# Patient Record
Sex: Female | Born: 2005 | Hispanic: Yes | Marital: Single | Smoking: Never smoker
Health system: Southern US, Community
[De-identification: ages and names within clinical notes are randomized; demographics above are authoritative.]

## PROBLEM LIST (undated history)

## (undated) DIAGNOSIS — N83209 Unspecified ovarian cyst, unspecified side: Secondary | ICD-10-CM

## (undated) DIAGNOSIS — K219 Gastro-esophageal reflux disease without esophagitis: Secondary | ICD-10-CM

---
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
## 2023-01-31 ENCOUNTER — Emergency Department: Payer: Medicaid Other

## 2023-01-31 ENCOUNTER — Other Ambulatory Visit: Payer: Self-pay

## 2023-01-31 ENCOUNTER — Emergency Department
Admission: EM | Admit: 2023-01-31 | Discharge: 2023-01-31 | Disposition: A | Discharge status: Home / Self Care | Payer: Medicaid Other | Attending: Emergency Medicine | Admitting: Emergency Medicine

## 2023-01-31 DIAGNOSIS — N390 Urinary tract infection, site not specified: Secondary | ICD-10-CM | POA: Insufficient documentation

## 2023-01-31 DIAGNOSIS — R1030 Lower abdominal pain, unspecified: Secondary | ICD-10-CM | POA: Diagnosis present

## 2023-01-31 DIAGNOSIS — R7401 Elevation of levels of liver transaminase levels: Secondary | ICD-10-CM | POA: Insufficient documentation

## 2023-01-31 DIAGNOSIS — M545 Low back pain, unspecified: Secondary | ICD-10-CM | POA: Insufficient documentation

## 2023-01-31 LAB — COMPREHENSIVE METABOLIC PANEL
ALT: 13 U/L (ref 0–44)
AST: 18 U/L (ref 15–41)
Albumin: 4 g/dL (ref 3.5–5.0)
Alkaline Phosphatase: 48 U/L (ref 38–126)
Anion gap: 10 (ref 5–15)
BUN: 12 mg/dL (ref 6–20)
CO2: 23 mmol/L (ref 22–32)
Calcium: 8.8 mg/dL — ABNORMAL LOW (ref 8.9–10.3)
Chloride: 104 mmol/L (ref 98–111)
Creatinine, Ser: 0.9 mg/dL (ref 0.44–1.00)
GFR, Estimated: >60 mL/min (ref >60)
Glucose, Bld: 74 mg/dL (ref 70–99)
Potassium: 3.4 mmol/L — ABNORMAL LOW (ref 3.5–5.1)
Sodium: 137 mmol/L (ref 135–145)
Total Bilirubin: 1.2 mg/dL (ref 0.3–1.2)
Total Protein: 7.5 g/dL (ref 6.5–8.1)

## 2023-01-31 LAB — URINALYSIS, ROUTINE W REFLEX MICROSCOPIC
Bilirubin Urine: NEGATIVE
Glucose, UA: NEGATIVE mg/dL
Hgb urine dipstick: NEGATIVE
Ketones, ur: NEGATIVE mg/dL
Nitrite: NEGATIVE
Protein, ur: NEGATIVE mg/dL
Specific Gravity, Urine: 1.018 (ref 1.005–1.030)
pH: 6 (ref 5.0–8.0)

## 2023-01-31 LAB — CBC
HCT: 37.1 % (ref 36.0–46.0)
Hemoglobin: 12.6 g/dL (ref 12.0–15.0)
MCH: 29 pg (ref 26.0–34.0)
MCHC: 34 g/dL (ref 30.0–36.0)
MCV: 85.5 fL (ref 80.0–100.0)
Platelets: 300 10*3/uL (ref 150–400)
RBC: 4.34 MIL/uL (ref 3.87–5.11)
RDW: 12.9 % (ref 11.5–15.5)
WBC: 9 10*3/uL (ref 4.0–10.5)
nRBC: 0 % (ref 0.0–0.2)

## 2023-01-31 LAB — LIPASE, BLOOD: Lipase: 73 U/L — ABNORMAL HIGH (ref 11–51)

## 2023-01-31 LAB — PREGNANCY, URINE: Preg Test, Ur: NEGATIVE

## 2023-01-31 MED ORDER — CEPHALEXIN 500 MG PO CAPS
500.0000 mg | ORAL_CAPSULE | Freq: Once | ORAL | Status: AC
  Administered 2023-01-31: 500 mg via ORAL
  Filled 2023-01-31: qty 1

## 2023-01-31 MED ORDER — IBUPROFEN 600 MG PO TABS
600.0000 mg | ORAL_TABLET | Freq: Once | ORAL | Status: AC
  Administered 2023-01-31: 600 mg via ORAL
  Filled 2023-01-31: qty 1

## 2023-01-31 MED ORDER — CEPHALEXIN 500 MG PO CAPS: 500.0000 mg | ORAL_CAPSULE | Freq: Two times a day (BID) | ORAL | 0 refills | Status: AC

## 2023-01-31 MED ORDER — IBUPROFEN 600 MG PO TABS: 600.0000 mg | ORAL_TABLET | Freq: Four times a day (QID) | ORAL | 0 refills | Status: DC | PRN

## 2023-01-31 NOTE — ED Notes (Signed)
Patient transported to CT 

## 2023-01-31 NOTE — ED Provider Notes (Signed)
[facility] Provider Note    Event Date/Time   First MD Initiated Contact with Patient 01/31/23 1638     (approximate)   History   Abdominal Pain   HPI  Dominique Hickman is a 17 y.o. female with positive ANA but otherwise no significant PMH who presents with lower back and abdominal pain since last night.  The patient states that she started having pain across both sides of her lower back that then radiated around to her abdomen.  It is slightly worse on the right but is still present on the left.  She denies any associated nausea or vomiting.  She denies any dysuria or hematuria but does have some hesitancy.  She denies any vaginal bleeding or discharge.  She has no diarrhea or change in her bowel movements.  I reviewed the past medical records.  The patient's most recent outpatient encounter was with Dr. Dimple Casey from rheumatology on 8/9 for evaluation of positive ANA.  She was ordered for additional rheumatologic workup at that time.   Physical Exam   Triage Vital Signs: ED Triage Vitals  Encounter Vitals Group     BP 01/31/23 1547 105/69     Systolic BP Percentile --      Diastolic BP Percentile --      Pulse Rate 01/31/23 1547 73     Resp 01/31/23 1547 17     Temp 01/31/23 1547 98 F (36.7 C)     Temp Source 01/31/23 1547 Oral     SpO2 01/31/23 1547 99 %     Weight 01/31/23 1548 135 lb (61.2 kg)     Height 01/31/23 1548 5\' 5"  (1.651 m)     Head Circumference --      Peak Flow --      Pain Score 01/31/23 1548 7     Pain Loc --      Pain Education --      Exclude from Growth Chart --     Most recent vital signs: Vitals:   01/31/23 2104 01/31/23 2125  BP: (!) 110/54   Pulse: 65   Resp:  16  Temp:  98.2 F (36.8 C)  SpO2: 100%      General: Alert, well-appearing, no distress.  CV:  Good peripheral perfusion.  Resp:  Normal effort.  Abd:  Soft with mild bilateral lower quadrant tenderness.  No distention.  Other:  Mild right CVA  tenderness.   ED Results / Procedures / Treatments   Labs (all labs ordered are listed, but only abnormal results are displayed) Labs Reviewed  LIPASE, BLOOD - Abnormal; Notable for the following components:      Result Value   Lipase 73 (*)    All other components within normal limits  COMPREHENSIVE METABOLIC PANEL - Abnormal; Notable for the following components:   Potassium 3.4 (*)    Calcium 8.8 (*)    All other components within normal limits  URINALYSIS, ROUTINE W REFLEX MICROSCOPIC - Abnormal; Notable for the following components:   Color, Urine YELLOW (*)    APPearance CLOUDY (*)    Leukocytes,Ua TRACE (*)    Bacteria, UA FEW (*)    All other components within normal limits  CBC  PREGNANCY, URINE  POC URINE PREG, ED     EKG     RADIOLOGY  CT renal stone study: I independently viewed and interpreted the images; there is no ureteral stone.  Radiology report indicates no acute abnormalities.  PROCEDURES:  Critical Care performed: No  Procedures   MEDICATIONS ORDERED IN ED: Medications  ibuprofen (ADVIL) tablet 600 mg (600 mg Oral Given 01/31/23 1728)  cephALEXin (KEFLEX) capsule 500 mg (500 mg Oral Given 01/31/23 2125)     IMPRESSION / MDM / ASSESSMENT AND PLAN / ED COURSE  I reviewed the triage vital signs and the nursing notes.  17 year old female with PMH as noted above presents with bilateral lower back pain radiating around to both sides of her lower abdomen, slightly worse on the right.  On exam she has mild bilateral lower quadrant tenderness and mild right CVA tenderness.  Initial labs show normal electrolytes on the CMP.  CBC shows no leukocytosis or anemia.  Lipase is minimally elevated but this appears chronic.  Urinalysis shows leukocyte esterace, some RBCs, WBCs, and bacteria.  Differential diagnosis includes, but is not limited to, ureteral stone, UTI, musculoskeletal pain, lightest, diverticulitis.  Given the relatively mild pain and the  location there is no evidence of ovarian cyst, fibroid, torsion, or other gynecologic etiology.  We will obtain CT for further evaluation.  Patient's presentation is most consistent with acute complicated illness / injury requiring diagnostic workup.  ----------------------------------------- 9:04 PM on 01/31/2023 -----------------------------------------  CT is negative.  Given the urinalysis findings, I will treat empirically for UTI.  I counseled the patient on the results of the workup.  She has had good relief with ibuprofen in the ED.  I will prescribe Keflex for outpatient treatment.  I gave strict return precautions and she expresses understanding.   FINAL CLINICAL IMPRESSION(S) / ED DIAGNOSES   Final diagnoses:  Lower abdominal pain  Urinary tract infection without hematuria, site unspecified     Rx / DC Orders   ED Discharge Orders          Ordered    cephALEXin (KEFLEX) 500 MG capsule  2 times daily        01/31/23 2103    ibuprofen (ADVIL) 600 MG tablet  Every 6 hours PRN        01/31/23 2103             Note:  This document was prepared using Dragon voice recognition software and may include unintentional dictation errors.    Dionne Bucy, MD 01/31/23 2358

## 2023-01-31 NOTE — Discharge Instructions (Signed)
Take the Keflex as prescribed and finish the full 7-day course.  You may take ibuprofen as needed for pain.  Follow up with your regular doctor.  Return to the ER for new, worsening, or persistent severe abdominal or back pain, fever, vomiting, weakness, or any other new or worsening symptoms that concern you.

## 2023-01-31 NOTE — ED Triage Notes (Signed)
Pt presents to ED with c/o of ABD pain that started last night. Pt denies fever or chills. Pt denies urinary s/s. NAD noted.

## 2023-04-25 ENCOUNTER — Emergency Department
Admission: EM | Admit: 2023-04-25 | Discharge: 2023-04-25 | Disposition: A | Discharge status: Home / Self Care | Payer: Medicaid Other | Attending: Emergency Medicine | Admitting: Emergency Medicine

## 2023-04-25 ENCOUNTER — Encounter: Payer: Self-pay | Admitting: Emergency Medicine

## 2023-04-25 ENCOUNTER — Other Ambulatory Visit: Payer: Self-pay

## 2023-04-25 ENCOUNTER — Emergency Department: Payer: Medicaid Other

## 2023-04-25 DIAGNOSIS — Z789 Other specified health status: Secondary | ICD-10-CM

## 2023-04-25 DIAGNOSIS — Z20822 Contact with and (suspected) exposure to covid-19: Secondary | ICD-10-CM | POA: Diagnosis not present

## 2023-04-25 DIAGNOSIS — T5191XA Toxic effect of unspecified alcohol, accidental (unintentional), initial encounter: Secondary | ICD-10-CM | POA: Insufficient documentation

## 2023-04-25 LAB — CBC WITH DIFFERENTIAL/PLATELET
Abs Immature Granulocytes: 0.02 10*3/uL (ref 0.00–0.07)
Basophils Absolute: 0 10*3/uL (ref 0.0–0.1)
Basophils Relative: 0 %
Eosinophils Absolute: 0.1 10*3/uL (ref 0.0–0.5)
Eosinophils Relative: 1 %
HCT: 38.4 % (ref 36.0–46.0)
Hemoglobin: 12.5 g/dL (ref 12.0–15.0)
Immature Granulocytes: 0 %
Lymphocytes Relative: 29 %
Lymphs Abs: 2.4 10*3/uL (ref 0.7–4.0)
MCH: 28.7 pg (ref 26.0–34.0)
MCHC: 32.6 g/dL (ref 30.0–36.0)
MCV: 88.1 fL (ref 80.0–100.0)
Monocytes Absolute: 0.7 10*3/uL (ref 0.1–1.0)
Monocytes Relative: 8 %
Neutro Abs: 5.1 10*3/uL (ref 1.7–7.7)
Neutrophils Relative %: 62 %
Platelets: 297 10*3/uL (ref 150–400)
RBC: 4.36 MIL/uL (ref 3.87–5.11)
RDW: 12.6 % (ref 11.5–15.5)
WBC: 8.4 10*3/uL (ref 4.0–10.5)
nRBC: 0 % (ref 0.0–0.2)

## 2023-04-25 LAB — BASIC METABOLIC PANEL
Anion gap: 6 (ref 5–15)
BUN: 17 mg/dL (ref 6–20)
CO2: 24 mmol/L (ref 22–32)
Calcium: 8.9 mg/dL (ref 8.9–10.3)
Chloride: 104 mmol/L (ref 98–111)
Creatinine, Ser: 0.86 mg/dL (ref 0.44–1.00)
GFR, Estimated: >60 mL/min (ref >60)
Glucose, Bld: 83 mg/dL (ref 70–99)
Potassium: 3.7 mmol/L (ref 3.5–5.1)
Sodium: 134 mmol/L — ABNORMAL LOW (ref 135–145)

## 2023-04-25 LAB — URINALYSIS, ROUTINE W REFLEX MICROSCOPIC
Bilirubin Urine: NEGATIVE
Glucose, UA: NEGATIVE mg/dL
Hgb urine dipstick: NEGATIVE
Ketones, ur: NEGATIVE mg/dL
Leukocytes,Ua: NEGATIVE
Nitrite: NEGATIVE
Protein, ur: 30 mg/dL — AB
Specific Gravity, Urine: 1.028 (ref 1.005–1.030)
pH: 8 (ref 5.0–8.0)

## 2023-04-25 LAB — RESP PANEL BY RT-PCR (RSV, FLU A&B, COVID)  RVPGX2
Influenza A by PCR: NEGATIVE
Influenza B by PCR: NEGATIVE
Resp Syncytial Virus by PCR: NEGATIVE
SARS Coronavirus 2 by RT PCR: NEGATIVE

## 2023-04-25 LAB — POC URINE PREG, ED: Preg Test, Ur: NEGATIVE

## 2023-04-25 LAB — TROPONIN I (HIGH SENSITIVITY): Troponin I (High Sensitivity): 3 ng/L (ref <18)

## 2023-04-25 NOTE — ED Triage Notes (Signed)
 Patient ambulatory to triage with complaints of numbness to arms and not feeling right. When asked to explain she states she drank a couple of beers last night for the first time. She felt weird and then began to breathe really heavy and fast and then her arms went numb. She also states that for 2 weeks my bones have ached.

## 2023-04-25 NOTE — ED Provider Notes (Signed)
 [facility] Provider Note    Event Date/Time   First MD Initiated Contact with Patient 04/25/23 [phone]     (approximate)   History   Panic Attack   HPI  Dominique Hickman Code is a 18 y.o. female who presents today for evaluation of feeling weird after drinking 2 beers last night.  Patient reports that she never drink alcohol before and she felt a heaviness in both of her arms.  She also reports that for the past 2 weeks she has felt weak in her arms and legs.  She is able to ambulate.  She denies fevers or chills.  She denies infectious type symptoms.  She reports that she does not feel this way every day.  She denies any other substance use.  Patient Active Problem List   Diagnosis Date Noted   Vitamin D  deficiency 12/01/2022   Positive ANA (antinuclear antibody) 12/01/2022   Polyarthralgia 12/01/2022   Patellofemoral pain syndrome of both knees 12/01/2022   Chest pain 07/11/2017          Physical Exam   Triage Vital Signs: ED Triage Vitals  Encounter Vitals Group     BP 04/25/23 0633 105/66     Systolic BP Percentile --      Diastolic BP Percentile --      Pulse Rate 04/25/23 0633 70     Resp 04/25/23 0633 16     Temp 04/25/23 0633 97.7 F (36.5 C)     Temp Source 04/25/23 0633 Oral     SpO2 04/25/23 0633 99 %     Weight 04/25/23 0629 140 lb (63.5 kg)     Height 04/25/23 0629 5' 5 (1.651 m)     Head Circumference --      Peak Flow --      Pain Score 04/25/23 0629 0     Pain Loc --      Pain Education --      Exclude from Growth Chart --     Most recent vital signs: Vitals:   04/25/23 0633  BP: 105/66  Pulse: 70  Resp: 16  Temp: 97.7 F (36.5 C)  SpO2: 99%    Physical Exam Vitals and nursing note reviewed.  Constitutional:      General: Awake and alert. No acute distress.    Appearance: Normal appearance. The patient is normal weight.  HENT:     Head: Normocephalic and atraumatic.     Mouth: Mucous membranes are moist.   Eyes:     General: PERRL. Normal EOMs        Right eye: No discharge.        Left eye: No discharge.     Conjunctiva/sclera: Conjunctivae normal.  Cardiovascular:     Rate and Rhythm: Normal rate and regular rhythm.     Pulses: Normal pulses.  Pulmonary:     Effort: Pulmonary effort is normal. No respiratory distress.     Breath sounds: Normal breath sounds.  Abdominal:     Abdomen is soft. There is no abdominal tenderness. No rebound or guarding. No distention. Musculoskeletal:        General: No swelling. Normal range of motion.     Cervical back: Normal range of motion and neck supple.  Skin:    General: Skin is warm and dry.     Capillary Refill: Capillary refill takes less than 2 seconds.     Findings: No rash.  Neurological:     Mental Status: The  patient is awake and alert.      ED Results / Procedures / Treatments   Labs (all labs ordered are listed, but only abnormal results are displayed) Labs Reviewed  BASIC METABOLIC PANEL - Abnormal; Notable for the following components:      Result Value   Sodium 134 (*)    All other components within normal limits  URINALYSIS, ROUTINE W REFLEX MICROSCOPIC - Abnormal; Notable for the following components:   Color, Urine YELLOW (*)    APPearance HAZY (*)    Protein, ur 30 (*)    Bacteria, UA RARE (*)    All other components within normal limits  RESP PANEL BY RT-PCR (RSV, FLU A&B, COVID)  [id number]  CBC WITH DIFFERENTIAL/PLATELET  POC URINE PREG, ED  TROPONIN I (HIGH SENSITIVITY)     EKG     RADIOLOGY I independently reviewed and interpreted imaging and agree with radiologists findings.     PROCEDURES:  Critical Care performed:   Procedures   MEDICATIONS ORDERED IN ED: Medications - No data to display   IMPRESSION / MDM / ASSESSMENT AND PLAN / ED COURSE  I reviewed the triage vital signs and the nursing notes.   Differential diagnosis includes, but is not limited to, anxiety, arrhythmia, electrolyte  disarray, EtOH intoxication, dehydration.  Patient presents emergency department awake and alert, hemodynamically stable and afebrile.  She is nontoxic in appearance.  She has no focal neurological deficits, she has normal strength and sensation in all 4 extremities.  No nuchal rigidity or neck pain.  Further workup is indicated. EKG obtained in triage is nonischemic, normal intervals, no arrhythmias.  Labs obtained are also reassuring.  No electrolyte disarray, no anemia, does not appear to be clinically dehydrated.  Chest x-ray is without cardiopulmonary abnormality.  Patient was observed in the emergency department for nearly 4 hours with significant improvement of her symptoms.  She requested to be discharged home.  She is with her family member.  All questions were answered and she was discharged in stable condition.  She does not appear to be clinically intoxicated.  She is ambulatory with a steady gait.  Her family member is driving.   Patient's presentation is most consistent with acute complicated illness / injury requiring diagnostic workup.    FINAL CLINICAL IMPRESSION(S) / ED DIAGNOSES   Final diagnoses:  Alcohol ingestion     Rx / DC Orders   ED Discharge Orders     None        Note:  This document was prepared using Dragon voice recognition software and may include unintentional dictation errors.   Magic Mohler E, PA-C 04/25/23 1332    Suzanne Kirsch, MD 04/25/23 [phone]

## 2023-04-25 NOTE — Discharge Instructions (Signed)
 Your EKG, labs, and urine do not show any remarkable abnormalities. Please follow up with your outpatient provider. It was a pleasure caring for you.

## 2023-05-15 ENCOUNTER — Emergency Department
Admission: EM | Admit: 2023-05-15 | Discharge: 2023-05-15 | Discharge status: Against Medical Advice | Payer: Medicaid Other | Attending: Emergency Medicine | Admitting: Emergency Medicine

## 2023-05-15 DIAGNOSIS — N899 Noninflammatory disorder of vagina, unspecified: Secondary | ICD-10-CM | POA: Diagnosis present

## 2023-05-15 DIAGNOSIS — Z5321 Procedure and treatment not carried out due to patient leaving prior to being seen by health care provider: Secondary | ICD-10-CM | POA: Insufficient documentation

## 2023-05-15 NOTE — ED Notes (Signed)
Called x3 with no answer from lobby. 

## 2023-07-19 ENCOUNTER — Encounter: Payer: Self-pay | Admitting: Cardiology

## 2023-07-19 ENCOUNTER — Ambulatory Visit: Admitting: Cardiology

## 2023-07-19 VITALS — BP 122/60 | HR 82 | Ht 65.0 in | Wt 143.0 lb

## 2023-07-19 DIAGNOSIS — F419 Anxiety disorder, unspecified: Secondary | ICD-10-CM | POA: Diagnosis not present

## 2023-07-19 DIAGNOSIS — Z0130 Encounter for examination of blood pressure without abnormal findings: Secondary | ICD-10-CM

## 2023-07-19 DIAGNOSIS — Z0000 Encounter for general adult medical examination without abnormal findings: Secondary | ICD-10-CM | POA: Insufficient documentation

## 2023-07-19 DIAGNOSIS — Z7689 Persons encountering health services in other specified circumstances: Secondary | ICD-10-CM | POA: Diagnosis not present

## 2023-07-19 DIAGNOSIS — R519 Headache, unspecified: Secondary | ICD-10-CM | POA: Diagnosis not present

## 2023-07-19 MED ORDER — HYDROXYZINE HCL 10 MG PO TABS
10.0000 mg | ORAL_TABLET | Freq: Three times a day (TID) | ORAL | 0 refills | Status: AC | PRN
Start: 2023-07-19 — End: ?

## 2023-07-19 MED ORDER — PROPRANOLOL HCL ER 80 MG PO CP24
80.0000 mg | ORAL_CAPSULE | Freq: Every day | ORAL | 11 refills | Status: AC
Start: 2023-07-19 — End: 2024-07-18

## 2023-07-19 NOTE — Progress Notes (Signed)
 New Patient Office Visit  Subjective   Patient ID: Dominique Hickman, female    DOB: 2006/01/09  Age: 18 y.o. MRN: [id number]  CC:  Chief Complaint  Patient presents with   New Patient (Initial Visit)    New Patient     HPI Dominique Hickman presents to establish care Previous Primary Care provider/office:   she does have additional concerns to discuss today.   Patient in office to establish care. Patient has multiple complaints today.  Patient complains of daily headaches. Will start patient on propranolol. Patient also complaining of anxiety and trouble sleeping. Will send in hydroxyzine. Will refer patient to psychiatry for counseling.  Patient also complaining of vaginal itching. Pap smear 04/2023 neg per patient, done at health department.     Outpatient Encounter Medications as of 07/19/2023  Medication Sig   Clindamycin-Benzoyl Per, Refr, gel Use to face in the morning.   clotrimazole (LOTRIMIN) 1 % cream Apply 1 Application topically 2 (two) times daily.   hydrOXYzine (ATARAX) 10 MG tablet Take 1 tablet (10 mg total) by mouth 3 (three) times daily as needed.   propranolol ER (INDERAL LA) 80 MG 24 hr capsule Take 1 capsule (80 mg total) by mouth daily.   tretinoin (RETIN-A) 0.1 % cream Apply topically.   [DISCONTINUED] VITAMIN D PO Take by mouth.   [DISCONTINUED] ibuprofen (ADVIL) 600 MG tablet Take 1 tablet (600 mg total) by mouth every 6 (six) hours as needed. (Patient not taking: Reported on 07/19/2023)   [DISCONTINUED] naproxen (NAPROSYN) 500 MG tablet Take 1 tablet (500 mg total) by mouth 2 (two) times daily with a meal. (Patient not taking: Reported on 12/01/2022)   [DISCONTINUED] NEOMYCIN-POLYMYXIN-HYDROCORTISONE (CORTISPORIN) 1 % SOLN OTIC solution Apply 1-2 drops to toe BID after soaking (Patient not taking: Reported on 12/01/2022)   [DISCONTINUED] Norethindrone Acetate-Ethinyl Estrad-FE (LOESTRIN 24 FE) 1-20 MG-MCG(24) tablet Take 1 tablet by mouth daily. (Patient  not taking: Reported on 12/01/2022)   No facility-administered encounter medications on file as of 07/19/2023.    Past Medical History:  Diagnosis Date   GERD (gastroesophageal reflux disease)    Ovarian cyst     History reviewed. No pertinent surgical history.  History reviewed. No pertinent family history.  Social History   Socioeconomic History   Marital status: Single    Spouse name: Not on file   Number of children: Not on file   Years of education: Not on file   Highest education level: Not on file  Occupational History   Not on file  Tobacco Use   Smoking status: Never    Passive exposure: Never   Smokeless tobacco: Never  Vaping Use   Vaping status: Never Used  Substance and Sexual Activity   Alcohol use: No   Drug use: No   Sexual activity: Yes  Other Topics Concern   Not on file  Social History Narrative   Not on file   Social Drivers of Health   Financial Resource Strain: Not on file  Food Insecurity: Not on file  Transportation Needs: Not on file  Physical Activity: Insufficiently Active (03/29/2022)   Received from [facility], [facility]   Exercise Vital Sign    Days of Exercise per Week: 4 days    Minutes of Exercise per Session: 30 min  Stress: Not on file  Social Connections: Not on file  Intimate Partner Violence: Not At Risk (03/29/2022)   Received from [facility], [facility]   Humiliation,  Afraid, Rape, and Kick questionnaire    Fear of Current or Ex-Partner: No    Emotionally Abused: No    Physically Abused: No    Sexually Abused: No    Review of Systems  Constitutional: Negative.   HENT: Negative.    Eyes: Negative.   Respiratory: Negative.  Negative for shortness of breath.   Cardiovascular: Negative.  Negative for chest pain.  Gastrointestinal: Negative.  Negative for abdominal pain, constipation and diarrhea.  Genitourinary: Negative.   Musculoskeletal:  Negative for joint pain and myalgias.  Skin:  Positive  for itching.  Neurological:  Positive for headaches. Negative for dizziness.  Endo/Heme/Allergies: Negative.   Psychiatric/Behavioral:  The patient is nervous/anxious.   All other systems reviewed and are negative.     Objective   BP 122/60   Pulse 82   Ht 5\' 5"  (1.651 m)   Wt 143 lb (64.9 kg)   SpO2 99%   BMI 23.80 kg/m   Physical Exam Vitals and nursing note reviewed.  Constitutional:      Appearance: Normal appearance. She is normal weight.  HENT:     Head: Normocephalic and atraumatic.     Nose: Nose normal.     Mouth/Throat:     Mouth: Mucous membranes are moist.  Eyes:     Extraocular Movements: Extraocular movements intact.     Conjunctiva/sclera: Conjunctivae normal.     Pupils: Pupils are equal, round, and reactive to light.  Cardiovascular:     Rate and Rhythm: Normal rate and regular rhythm.     Pulses: Normal pulses.     Heart sounds: Normal heart sounds.  Pulmonary:     Effort: Pulmonary effort is normal.     Breath sounds: Normal breath sounds.  Abdominal:     General: Abdomen is flat. Bowel sounds are normal.     Palpations: Abdomen is soft.  Musculoskeletal:        General: Normal range of motion.     Cervical back: Normal range of motion.  Skin:    General: Skin is warm and dry.  Neurological:     General: No focal deficit present.     Mental Status: She is alert and oriented to person, place, and time.  Psychiatric:        Mood and Affect: Mood normal.        Behavior: Behavior normal.        Thought Content: Thought content normal.        Judgment: Judgment normal.        Assessment & Plan:  Propranolol for headaches Hydroxyzine for anxiety, sleep and itching Referral sent o psychiatry for anxiety  Problem List Items Addressed This Visit       Other   Encounter to establish care - Primary   Nonintractable headache   Relevant Medications   propranolol ER (INDERAL LA) 80 MG 24 hr capsule   Anxiety   Relevant Medications    hydrOXYzine (ATARAX) 10 MG tablet   Other Relevant Orders   Ambulatory referral to Psychiatry    Return in about 4 weeks (around 08/16/2023) for for physical.   Total time spent: 25 minutes  Chandel Zaun, NP  07/19/2023   This document may have been prepared by Dragon Voice Recognition software and as such may include unintentional dictation errors.

## 2023-07-31 ENCOUNTER — Encounter: Payer: Self-pay | Admitting: Cardiology

## 2023-07-31 ENCOUNTER — Ambulatory Visit: Admitting: Cardiology

## 2023-07-31 VITALS — BP 116/64 | HR 80 | Ht 65.0 in | Wt 143.0 lb

## 2023-07-31 DIAGNOSIS — Z0130 Encounter for examination of blood pressure without abnormal findings: Secondary | ICD-10-CM

## 2023-07-31 DIAGNOSIS — J301 Allergic rhinitis due to pollen: Secondary | ICD-10-CM | POA: Diagnosis not present

## 2023-07-31 NOTE — Progress Notes (Signed)
 Established Patient Office Visit  Subjective:  Patient ID: Dominique Hickman, female    DOB: 20-Nov-2005  Age: 18 y.o. MRN: [id number]  Chief Complaint  Patient presents with   Acute Visit    Cough x 1 day, Sore Throat/ Headache/ Pain L side when cough    Patient in office for an acute visit. Patient complaining of cough, sore throat, headache and left side pain from coughing. Symptoms started yesterday. Patient took Tylenol with no relief. Recommend starting an antihistamine.   Cough This is a new problem. The current episode started yesterday. The problem has been unchanged. Associated symptoms include headaches, rhinorrhea and a sore throat. Pertinent negatives include no chest pain, fever, myalgias, nasal congestion, postnasal drip or shortness of breath. Nothing aggravates the symptoms. Treatments tried: Tylenol. The treatment provided no relief.    No other concerns at this time.   Past Medical History:  Diagnosis Date   GERD (gastroesophageal reflux disease)    Ovarian cyst     History reviewed. No pertinent surgical history.  Social History   Socioeconomic History   Marital status: Single    Spouse name: Not on file   Number of children: Not on file   Years of education: Not on file   Highest education level: Not on file  Occupational History   Not on file  Tobacco Use   Smoking status: Never    Passive exposure: Never   Smokeless tobacco: Never  Vaping Use   Vaping status: Never Used  Substance and Sexual Activity   Alcohol use: No   Drug use: No   Sexual activity: Yes  Other Topics Concern   Not on file  Social History Narrative   Not on file   Social Drivers of Health   Financial Resource Strain: Not on file  Food Insecurity: Not on file  Transportation Needs: Not on file  Physical Activity: Insufficiently Active (03/29/2022)   Received from [facility], [facility]   Exercise Vital Sign    Days of Exercise per Week: 4 days    Minutes  of Exercise per Session: 30 min  Stress: Not on file  Social Connections: Not on file  Intimate Partner Violence: Not At Risk (03/29/2022)   Received from [facility], [facility]   Humiliation, Afraid, Rape, and Kick questionnaire    Fear of Current or Ex-Partner: No    Emotionally Abused: No    Physically Abused: No    Sexually Abused: No    History reviewed. No pertinent family history.  Allergies  Allergen Reactions   Egg-Derived Products Other (See Comments)    "fever"    Outpatient Medications Prior to Visit  Medication Sig   Clindamycin-Benzoyl Per, Refr, gel Use to face in the morning.   clotrimazole (LOTRIMIN) 1 % cream Apply 1 Application topically 2 (two) times daily.   hydrOXYzine (ATARAX) 10 MG tablet Take 1 tablet (10 mg total) by mouth 3 (three) times daily as needed.   propranolol ER (INDERAL LA) 80 MG 24 hr capsule Take 1 capsule (80 mg total) by mouth daily.   tretinoin (RETIN-A) 0.1 % cream Apply topically.   No facility-administered medications prior to visit.    Review of Systems  Constitutional: Negative.  Negative for fever.  HENT:  Positive for rhinorrhea and sore throat. Negative for congestion, postnasal drip and sinus pain.   Eyes: Negative.   Respiratory:  Positive for cough. Negative for sputum production and shortness of breath.  Cardiovascular: Negative.  Negative for chest pain.  Gastrointestinal: Negative.  Negative for abdominal pain, constipation and diarrhea.  Genitourinary: Negative.   Musculoskeletal:  Negative for joint pain and myalgias.  Skin: Negative.   Neurological:  Positive for headaches. Negative for dizziness.  Endo/Heme/Allergies: Negative.   All other systems reviewed and are negative.      Objective:   BP 116/64   Pulse 80   Ht 5\' 5"  (1.651 m)   Wt 143 lb (64.9 kg)   SpO2 99%   BMI 23.80 kg/m   Vitals:   07/31/23 1358  BP: 116/64  Pulse: 80  Height: 5\' 5"  (1.651 m)  Weight: 143 lb (64.9 kg)   SpO2: 99%  BMI (Calculated): 23.8    Physical Exam Vitals and nursing note reviewed.  Constitutional:      Appearance: Normal appearance. She is normal weight.  HENT:     Head: Normocephalic and atraumatic.     Nose: Nose normal.     Mouth/Throat:     Mouth: Mucous membranes are moist.  Eyes:     Extraocular Movements: Extraocular movements intact.     Conjunctiva/sclera: Conjunctivae normal.     Pupils: Pupils are equal, round, and reactive to light.  Cardiovascular:     Rate and Rhythm: Normal rate and regular rhythm.     Pulses: Normal pulses.     Heart sounds: Normal heart sounds.  Pulmonary:     Effort: Pulmonary effort is normal.     Breath sounds: Normal breath sounds.  Abdominal:     General: Abdomen is flat. Bowel sounds are normal.     Palpations: Abdomen is soft.  Musculoskeletal:        General: Normal range of motion.     Cervical back: Normal range of motion.  Skin:    General: Skin is warm and dry.  Neurological:     General: No focal deficit present.     Mental Status: She is alert and oriented to person, place, and time.  Psychiatric:        Mood and Affect: Mood normal.        Behavior: Behavior normal.        Thought Content: Thought content normal.        Judgment: Judgment normal.      No results found for any visits on 07/31/23.  No results found for this or any previous visit (from the past 2160 hours).    Assessment & Plan:  Start an antihistamine OTC cough syrup Mucinex Throat lozenges or cough drops Increase water intake  Problem List Items Addressed This Visit       Respiratory   Seasonal allergic rhinitis due to pollen - Primary    Return if symptoms worsen or fail to improve.   Total time spent: 25 minutes  [employer], NP  07/31/2023   This document may have been prepared by Dragon Voice Recognition software and as such may include unintentional dictation errors.

## 2023-07-31 NOTE — Patient Instructions (Signed)
 Antihistamine such as Claritin, zyrtec, or Allegra Over the counter cough medicine Or Mucinex allergy Drink plenty of water Throat lozenges or cough drops

## 2023-08-16 ENCOUNTER — Other Ambulatory Visit: Payer: Self-pay | Admitting: Family

## 2023-08-16 ENCOUNTER — Encounter: Payer: Self-pay | Admitting: Cardiology

## 2023-08-16 ENCOUNTER — Ambulatory Visit: Admitting: Cardiology

## 2023-08-16 VITALS — BP 114/60 | HR 81 | Ht 65.0 in | Wt 144.0 lb

## 2023-08-16 DIAGNOSIS — I10 Essential (primary) hypertension: Secondary | ICD-10-CM

## 2023-08-16 DIAGNOSIS — Z131 Encounter for screening for diabetes mellitus: Secondary | ICD-10-CM

## 2023-08-16 DIAGNOSIS — Z1321 Encounter for screening for nutritional disorder: Secondary | ICD-10-CM

## 2023-08-16 DIAGNOSIS — E538 Deficiency of other specified B group vitamins: Secondary | ICD-10-CM

## 2023-08-16 DIAGNOSIS — Z0000 Encounter for general adult medical examination without abnormal findings: Secondary | ICD-10-CM

## 2023-08-16 DIAGNOSIS — Z1329 Encounter for screening for other suspected endocrine disorder: Secondary | ICD-10-CM

## 2023-08-16 DIAGNOSIS — Z0001 Encounter for general adult medical examination with abnormal findings: Secondary | ICD-10-CM | POA: Diagnosis not present

## 2023-08-16 DIAGNOSIS — R42 Dizziness and giddiness: Secondary | ICD-10-CM

## 2023-08-16 DIAGNOSIS — Z13220 Encounter for screening for lipoid disorders: Secondary | ICD-10-CM

## 2023-08-16 NOTE — Progress Notes (Signed)
 Complete physical exam  Patient: Dominique Hickman   DOB: 11-Nov-2005   17 y.o. Female  MRN: [id number]  Subjective:    Chief Complaint  Patient presents with   Annual Exam    CPE no PAP    Dominique Hickman is a 18 y.o. female who presents today for a complete physical exam. She reports consuming a general diet. Gym/ health club routine includes cardio, light weights, and treadmill. She generally feels well. She reports sleeping well. She does not have additional problems to discuss today.    Most recent fall risk assessment:     No data to display           Most recent depression screenings:    07/19/2023   10:45 AM  PHQ 2/9 Scores  PHQ - 2 Score 1  PHQ- 9 Score 6    Vision:Within last year and Dental: No current dental problems and Receives regular dental care  Past Medical History:  Diagnosis Date   GERD (gastroesophageal reflux disease)    Ovarian cyst     History reviewed. No pertinent surgical history.  History reviewed. No pertinent family history.  Social History   Socioeconomic History   Marital status: Single    Spouse name: Not on file   Number of children: Not on file   Years of education: Not on file   Highest education level: Not on file  Occupational History   Not on file  Tobacco Use   Smoking status: Never    Passive exposure: Never   Smokeless tobacco: Never  Vaping Use   Vaping status: Never Used  Substance and Sexual Activity   Alcohol use: No   Drug use: No   Sexual activity: Yes  Other Topics Concern   Not on file  Social History Narrative   Not on file   Social Drivers of Health   Financial Resource Strain: Not on file  Food Insecurity: Not on file  Transportation Needs: Not on file  Physical Activity: Insufficiently Active (03/29/2022)   Received from [facility], [facility]   Exercise Vital Sign    Days of Exercise per Week: 4 days    Minutes of Exercise per Session: 30 min  Stress: Not on file  Social  Connections: Not on file  Intimate Partner Violence: Not At Risk (03/29/2022)   Received from [facility], [facility]   Humiliation, Afraid, Rape, and Kick questionnaire    Fear of Current or Ex-Partner: No    Emotionally Abused: No    Physically Abused: No    Sexually Abused: No    Outpatient Medications Prior to Visit  Medication Sig   Clindamycin-Benzoyl Per, Refr, gel Use to face in the morning.   clotrimazole (LOTRIMIN) 1 % cream Apply 1 Application topically 2 (two) times daily.   hydrOXYzine  (ATARAX ) 10 MG tablet Take 1 tablet (10 mg total) by mouth 3 (three) times daily as needed.   propranolol  ER (INDERAL  LA) 80 MG 24 hr capsule Take 1 capsule (80 mg total) by mouth daily.   tretinoin (RETIN-A) 0.1 % cream Apply topically.   No facility-administered medications prior to visit.    Review of Systems  Constitutional: Negative.   HENT: Negative.    Eyes: Negative.   Respiratory: Negative.  Negative for shortness of breath.   Cardiovascular: Negative.  Negative for chest pain.  Gastrointestinal: Negative.  Negative for abdominal pain, constipation and diarrhea.  Genitourinary: Negative.   Musculoskeletal:  Negative for  joint pain and myalgias.  Skin: Negative.   Neurological: Negative.  Negative for dizziness and headaches.  Endo/Heme/Allergies: Negative.   All other systems reviewed and are negative.       Objective:     BP 114/60   Pulse 81   Ht 5\' 5"  (1.651 m)   Wt 144 lb (65.3 kg)   SpO2 98%   BMI 23.96 kg/m  BP Readings from Last 3 Encounters:  08/16/23 114/60  07/31/23 116/64  07/19/23 122/60      Physical Exam Vitals and nursing note reviewed.  Constitutional:      Appearance: Normal appearance. She is normal weight.  HENT:     Head: Normocephalic and atraumatic.     Nose: Nose normal.     Mouth/Throat:     Mouth: Mucous membranes are moist.  Eyes:     Extraocular Movements: Extraocular movements intact.     Conjunctiva/sclera:  Conjunctivae normal.     Pupils: Pupils are equal, round, and reactive to light.  Cardiovascular:     Rate and Rhythm: Normal rate and regular rhythm.     Pulses: Normal pulses.     Heart sounds: Normal heart sounds.  Pulmonary:     Effort: Pulmonary effort is normal.     Breath sounds: Normal breath sounds.  Abdominal:     General: Abdomen is flat. Bowel sounds are normal.     Palpations: Abdomen is soft.  Musculoskeletal:        General: Normal range of motion.     Cervical back: Normal range of motion.  Skin:    General: Skin is warm and dry.  Neurological:     General: No focal deficit present.     Mental Status: She is alert and oriented to person, place, and time.  Psychiatric:        Mood and Affect: Mood normal.        Behavior: Behavior normal.        Thought Content: Thought content normal.        Judgment: Judgment normal.      No results found for any visits on 08/16/23.  No results found for this or any previous visit (from the past 2160 hours).      Assessment & Plan:    Routine Health Maintenance and Physical Exam   There is no immunization history on file for this patient.  Health Maintenance  Topic Date Due   CHLAMYDIA SCREENING  Never done   HPV VACCINES (1 - 3-dose series) Never done   HIV Screening  Never done   Meningococcal B Vaccine (1 of 2 - Standard) Never done   Hepatitis C Screening  Never done   DTaP/Tdap/Td (1 - Tdap) Never done   COVID-19 Vaccine (1 - 2024-25 season) Never done   INFLUENZA VACCINE  11/23/2023    Discussed health benefits of physical activity, and encouraged her to engage in regular exercise appropriate for her age and condition.  Problem List Items Addressed This Visit       Other   Encounter for annual health examination - Primary   Return in about 1 year (around 08/15/2024).     Alica Antu, NP  08/16/2023   This document may have been prepared by [facility] Voice Recognition software and as such may  include unintentional dictation errors.

## 2023-08-17 LAB — CMP14+EGFR
ALT: 15 IU/L (ref 0–32)
AST: 17 IU/L (ref 0–40)
Albumin: 4.5 g/dL (ref 4.0–5.0)
Alkaline Phosphatase: 71 IU/L (ref 42–106)
BUN/Creatinine Ratio: 15 (ref 9–23)
BUN: 11 mg/dL (ref 6–20)
Bilirubin Total: 0.3 mg/dL (ref 0.0–1.2)
CO2: 23 mmol/L (ref 20–29)
Calcium: 9.5 mg/dL (ref 8.7–10.2)
Chloride: 104 mmol/L (ref 96–106)
Creatinine, Ser: 0.74 mg/dL (ref 0.57–1.00)
Globulin, Total: 3 g/dL (ref 1.5–4.5)
Glucose: 79 mg/dL (ref 70–99)
Potassium: 4 mmol/L (ref 3.5–5.2)
Sodium: 140 mmol/L (ref 134–144)
Total Protein: 7.5 g/dL (ref 6.0–8.5)
eGFR: 119 mL/min/{1.73_m2} (ref >59)

## 2023-08-17 LAB — IRON,TIBC AND FERRITIN PANEL
Ferritin: 18 ng/mL (ref 15–77)
Iron Saturation: 21 % (ref 15–55)
Iron: 72 ug/dL (ref 27–159)
Total Iron Binding Capacity: 340 ug/dL (ref 250–450)
UIBC: 268 ug/dL (ref 131–425)

## 2023-08-17 LAB — VITAMIN D 25 HYDROXY (VIT D DEFICIENCY, FRACTURES): Vit D, 25-Hydroxy: 13.4 ng/mL — ABNORMAL LOW (ref 30.0–100.0)

## 2023-08-17 LAB — HEMOGLOBIN A1C
Est. average glucose Bld gHb Est-mCnc: 103 mg/dL
Hgb A1c MFr Bld: 5.2 % (ref 4.8–5.6)

## 2023-08-17 LAB — LIPID PANEL
Chol/HDL Ratio: 4 ratio (ref 0.0–4.4)
Cholesterol, Total: 183 mg/dL — ABNORMAL HIGH (ref 100–169)
HDL: 46 mg/dL (ref >39)
LDL Chol Calc (NIH): 80 mg/dL (ref 0–109)
Triglycerides: 354 mg/dL — ABNORMAL HIGH (ref 0–89)
VLDL Cholesterol Cal: 57 mg/dL — ABNORMAL HIGH (ref 5–40)

## 2023-08-17 LAB — TSH: TSH: 1.49 u[IU]/mL (ref 0.450–4.500)

## 2023-08-17 LAB — VITAMIN B12: Vitamin B-12: 272 pg/mL (ref 232–1245)

## 2023-08-24 ENCOUNTER — Ambulatory Visit: Admitting: Family

## 2023-09-03 ENCOUNTER — Ambulatory Visit (INDEPENDENT_AMBULATORY_CARE_PROVIDER_SITE_OTHER): Admitting: Family

## 2023-09-03 DIAGNOSIS — J029 Acute pharyngitis, unspecified: Secondary | ICD-10-CM

## 2023-09-03 DIAGNOSIS — J069 Acute upper respiratory infection, unspecified: Secondary | ICD-10-CM

## 2023-09-03 LAB — POCT XPERT XPRESS SARS COVID-2/FLU/RSV
FLU A: NEGATIVE
FLU B: NEGATIVE
RSV RNA, PCR: NEGATIVE
SARS Coronavirus 2: NEGATIVE

## 2023-09-03 LAB — POCT RAPID STREP A (OFFICE): Rapid Strep A Screen: NEGATIVE

## 2023-09-03 NOTE — Progress Notes (Signed)
   Subjective   CHIEF COMPLAINT  COVID Testing    REASON FOR VISIT  COVID testing for URI symptoms.        Objective   Results for orders placed or performed in visit on 09/03/23  POCT rapid strep A  Result Value Ref Range   Rapid Strep A Screen Negative Negative  POCT XPERT XPRESS SARS COVID-2/FLU/RSV  Result Value Ref Range   SARS Coronavirus 2 Negative    FLU A Negative    FLU B Negative    RSV RNA, PCR Negative     Assessment & Plan  1. Acute upper respiratory infection (Primary) Patient instructed to call if she needs antibiotics later in the week.   - POCT rapid strep A - POCT XPERT XPRESS SARS COVID-2/FLU/RSV  2. Sore throat  - POCT rapid strep A   Total time spent: 5 minutes  Trenda Frisk, FNP 09/03/2023
